# Patient Record
Sex: Male | Born: 1955 | Race: Black or African American | Hispanic: No | Smoking: Current every day smoker
Health system: Southern US, Community
[De-identification: ages and names within clinical notes are randomized; demographics above are authoritative.]

## PROBLEM LIST (undated history)

## (undated) DIAGNOSIS — I10 Essential (primary) hypertension: Secondary | ICD-10-CM

## (undated) HISTORY — DX: Essential (primary) hypertension: I10

---

## 1980-07-28 HISTORY — PX: TESTICLE SURGERY: SHX794

## 2008-01-13 ENCOUNTER — Emergency Department (HOSPITAL_COMMUNITY): Admission: EM | Admit: 2008-01-13 | Discharge: 2008-01-13 | Payer: Self-pay | Admitting: Emergency Medicine

## 2012-08-04 ENCOUNTER — Emergency Department (HOSPITAL_COMMUNITY): Payer: BC Managed Care – PPO

## 2012-08-04 ENCOUNTER — Emergency Department (HOSPITAL_COMMUNITY)
Admission: EM | Admit: 2012-08-04 | Discharge: 2012-08-04 | Disposition: A | Discharge status: Home / Self Care | Payer: BC Managed Care – PPO | Attending: Emergency Medicine | Admitting: Emergency Medicine

## 2012-08-04 ENCOUNTER — Encounter (HOSPITAL_COMMUNITY): Payer: Self-pay | Admitting: *Deleted

## 2012-08-04 DIAGNOSIS — N453 Epididymo-orchitis: Secondary | ICD-10-CM | POA: Insufficient documentation

## 2012-08-04 DIAGNOSIS — Z9089 Acquired absence of other organs: Secondary | ICD-10-CM | POA: Insufficient documentation

## 2012-08-04 DIAGNOSIS — F17200 Nicotine dependence, unspecified, uncomplicated: Secondary | ICD-10-CM | POA: Insufficient documentation

## 2012-08-04 DIAGNOSIS — Z7982 Long term (current) use of aspirin: Secondary | ICD-10-CM | POA: Insufficient documentation

## 2012-08-04 DIAGNOSIS — N451 Epididymitis: Secondary | ICD-10-CM

## 2012-08-04 DIAGNOSIS — N509 Disorder of male genital organs, unspecified: Secondary | ICD-10-CM | POA: Insufficient documentation

## 2012-08-04 LAB — URINE MICROSCOPIC-ADD ON

## 2012-08-04 LAB — URINALYSIS, ROUTINE W REFLEX MICROSCOPIC
Glucose, UA: NEGATIVE mg/dL
Hgb urine dipstick: NEGATIVE
Protein, ur: NEGATIVE mg/dL
pH: 6 (ref 5.0–8.0)

## 2012-08-04 MED ORDER — CIPROFLOXACIN HCL 500 MG PO TABS
500.0000 mg | ORAL_TABLET | Freq: Once | ORAL | Status: AC
  Administered 2012-08-04: 500 mg via ORAL
  Filled 2012-08-04: qty 1

## 2012-08-04 MED ORDER — CIPROFLOXACIN HCL 500 MG PO TABS: 500.0000 mg | ORAL_TABLET | Freq: Two times a day (BID) | ORAL | Status: DC

## 2012-08-04 NOTE — ED Provider Notes (Signed)
MSE was initiated and I personally evaluated the patient and placed orders including UA and scrotal ultrasound at  5:54 PM on August 04, 2012.  57 year old male presents with swelling and tenderness to his testicle beginning on Thursday after flopping down onto his testicle in a chair. He only has his left testicle is is right testicle was injured in a baseball accident many years ago. Pain is intermittent, only present if he sits for any minute or presses it in one certain area. On exam patient has one testicle that is difficult to determine whether or not it is swollen because he does not have another to compare to. No erythema. No penile tenderness or discharge. Tenderness to palpation of the testicle posteriorly. Obtain a urine sample and scrotal ultrasound.  The patient appears stable so that the remainder of the MSE may be completed by another provider.  Trevor Mace, PA-C 08/04/12 1756

## 2012-08-04 NOTE — ED Provider Notes (Signed)
History    CSN: [id number] Arrival date & time 08/04/12  1721 First MD Initiated Contact with Patient 08/04/12 1753      Chief Complaint  Patient presents with  . Groin Swelling    HPI Patient complains of testicular pain since Thursday afternoon. Patient states he sat down on the chair and sat onto his testicle. 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  He initially did not think too much of it but he has had persistent pain and noticed some swelling on the testicle. Patient denies any discharge. He does not have any nausea vomiting fevers or other complaints. History reviewed. No pertinent past medical history.  Past Surgical History  Procedure Date  . Testicle surgery 1982    History reviewed. No pertinent family history.  History  Substance Use Topics  . Smoking status: Current Every Day Smoker    Types: Cigarettes  . Smokeless tobacco: Not on file  . Alcohol Use: Yes      Review of Systems  All other systems reviewed and are negative.    Allergies  Review of patient's allergies indicates no known allergies.  Home Medications   Current Outpatient Rx  Name  Route  Sig  Dispense  Refill  . ASPIRIN 325 MG PO TABS   Oral   Take 325 mg by mouth every 4 (four) hours as needed. Pain         . IBUPROFEN 200 MG PO TABS   Oral   Take 200 mg by mouth every 6 (six) hours as needed. Pain           BP 142/83  Pulse 91  Temp 99.5 F (37.5 C) (Oral)  Resp 20  Ht 6\' 1"  (1.854 m)  Wt 150 lb (68.04 kg)  BMI 19.79 kg/m2  SpO2 100%  Physical Exam  Nursing note and vitals reviewed. Constitutional: He appears well-developed and well-nourished. No distress.  HENT:  Head: Normocephalic and atraumatic.  Right Ear: External ear normal.  Left Ear: External ear normal.  Eyes: Conjunctivae normal are normal. Right eye exhibits no discharge. Left eye exhibits no discharge. No scleral icterus.  Neck: Neck supple. No tracheal deviation  present.  Cardiovascular: Normal rate, regular rhythm and intact distal pulses.   Pulmonary/Chest: Effort normal and breath sounds normal. No stridor. No respiratory distress. He has no wheezes. He has no rales.  Abdominal: Soft. Bowel sounds are normal. He exhibits no distension. There is no tenderness. There is no rebound and no guarding.  Genitourinary:       Tenderness to palpation proximal aspect of testicle, as small amount of induration, no scrotal erythema or edema  Musculoskeletal: He exhibits no edema and no tenderness.  Neurological: He is alert. He has normal strength. No sensory deficit. Cranial nerve deficit:  no gross defecits noted. He exhibits normal muscle tone. He displays no seizure activity. Coordination normal.  Skin: Skin is warm and dry. No rash noted.  Psychiatric: He has a normal mood and affect.    ED Course  Procedures (including critical care time)  Labs Reviewed  URINALYSIS, ROUTINE W REFLEX MICROSCOPIC - Abnormal; Notable for the following:    Color, Urine AMBER (*)  BIOCHEMICALS MAY BE AFFECTED BY COLOR   Bilirubin Urine SMALL (*)     Ketones, ur TRACE (*)     Leukocytes, UA MODERATE (*)     All other components within normal limits  URINE MICROSCOPIC-ADD ON - Abnormal; Notable for the  following:    Squamous Epithelial / LPF FEW (*)     Casts GRANULAR CAST (*)     All other components within normal limits   US Scrotum  08/04/2012  *RADIOLOGY REPORT*  Clinical Data: Groin swelling, pain  ULTRASOUND OF SCROTUM  Technique:  Complete ultrasound examination of the testicles, epididymis, and other scrotal structures was performed.  Comparison:  None.  Findings:  Right testis:  Measures 4 by 2.3 x 1.8 cm.  There is heterogeneous echogenicity without definite focal mass.  No evidence of testicular torsion.  Normal arterial and venous waveform. Testicular microlithiasis noted.  Left testis:  Surgically absent  Right epididymis:  There is enlargement of the right  epididymis measures 2 x 1.7 cm.  There is increased flow in the right epididymis.  Findings are highly suspicious for right epididymitis. Clinical correlation is necessary.  A cyst in the right epididymis measures 1.1 x 0.8 cm.  Hydrocele: Absent  Varicocele: Absent  IMPRESSION:  1.  There is heterogeneous echogenicity of the right testicle without focal mass or testicular torsion.  Multiple punctate calcifications are noted consistent with microlithiasis. 2.  There is enlargement of the right epididymis with increased flow.  Findings are highly suspicious for right epididymitis. Clinical correlation is necessary.  A cyst in right epididymis measures 1.1 x 0.8 cm. 3.  No varicocele or hydrocele.   Original Report Authenticated By: Natasha Mead, M.D.        MDM  The patient's ultrasound does not show significant injury. He does show the possibility of an epididymitis. Urinalysis does show a few white blood cells which would be consistent with epididymitis.  The patient is nontoxic and well-appearing. He'll be discharged on a course of oral antibiotics.        Celene Kras, MD 08/04/12 2045

## 2012-08-04 NOTE — ED Notes (Signed)
Pt reports swelling to testicle, denies known injury other than "flopping down in a chair." states "I only have 1 due to injury back in 1982."

## 2012-08-04 NOTE — ED Notes (Signed)
Patient transported to Ultrasound 

## 2014-06-25 IMAGING — US US SCROTUM
1 series · 13 of 25 positions shown · non-contrast
Comparison: None.

CLINICAL DATA: Groin swelling, pain

ULTRASOUND OF SCROTUM
TECHNIQUE: Complete ultrasound examination of the testicles,
epididymis, and other scrotal structures was performed.

[Series 1: us scrotum · 0.08mm/px · 13 of 34 slices shown]
[im 1/34]
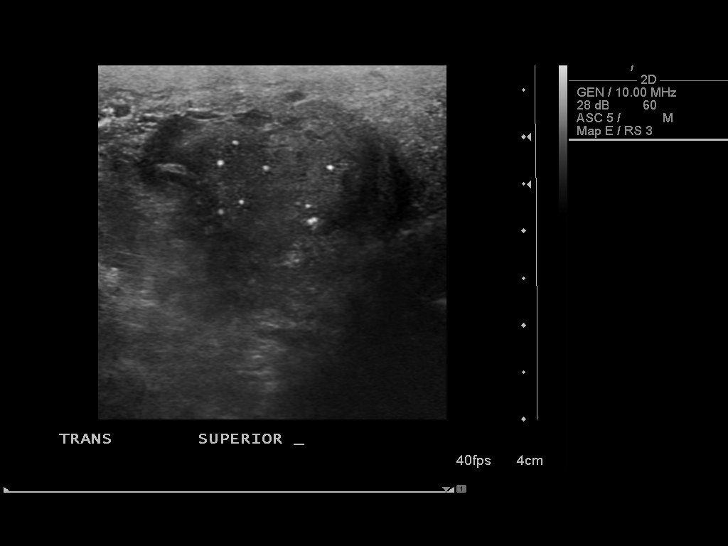
[im 3/34]
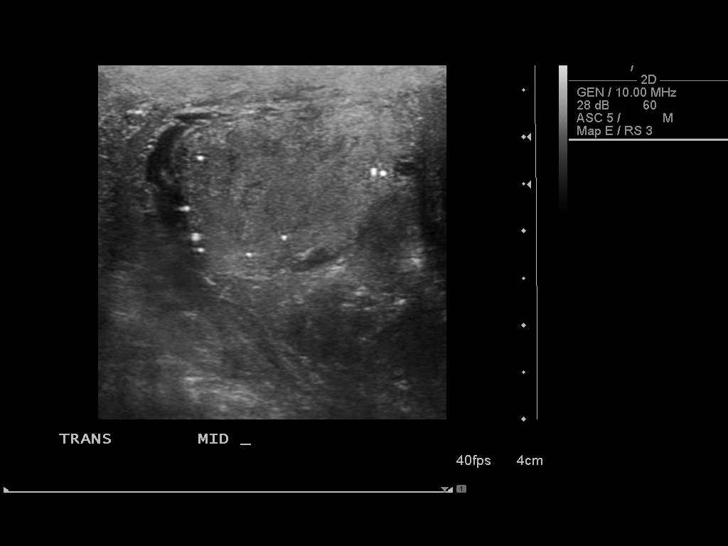
[im 6/34]
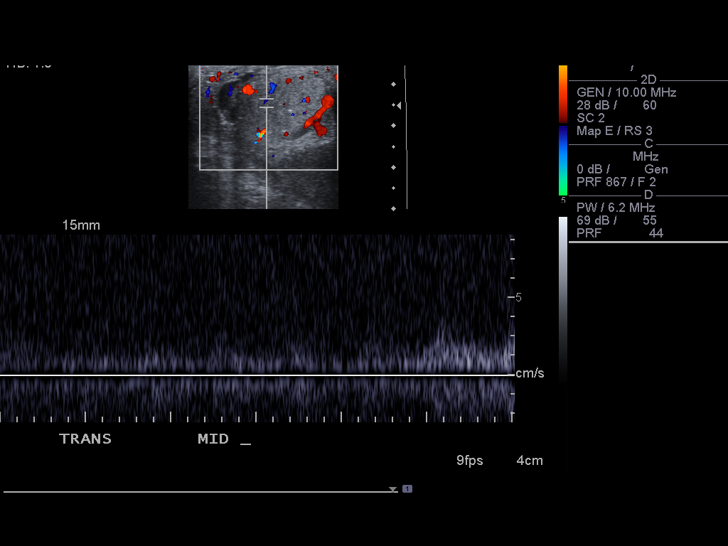
[im 9/34]
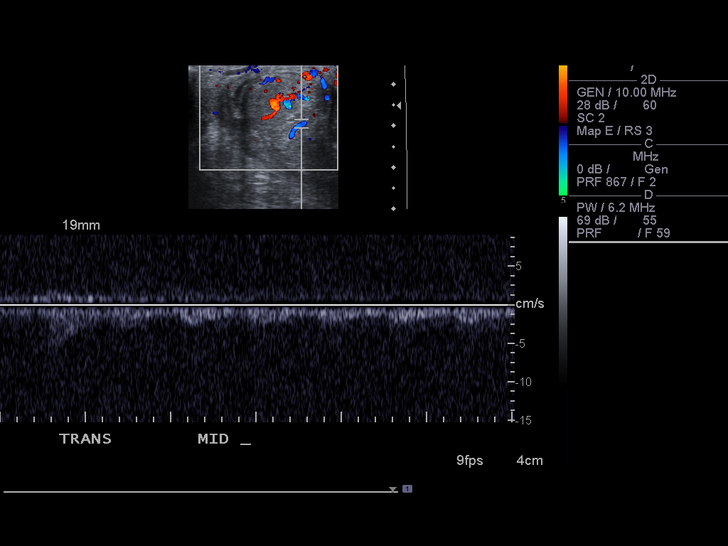
[im 12/34]
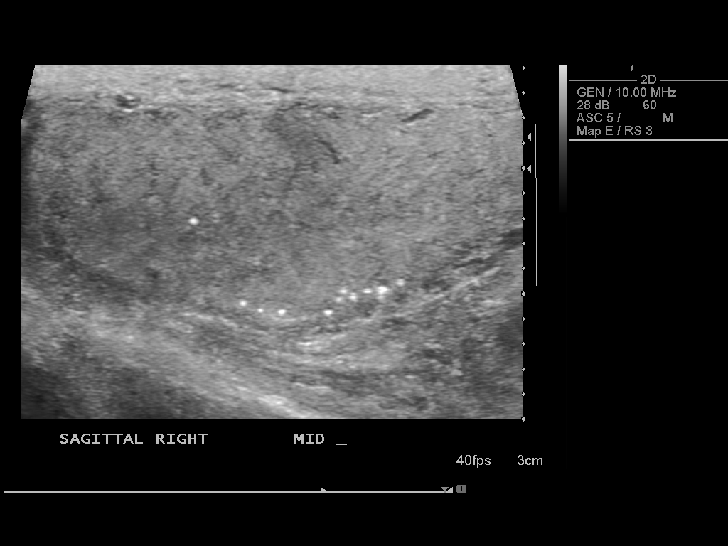
[im 14/34]
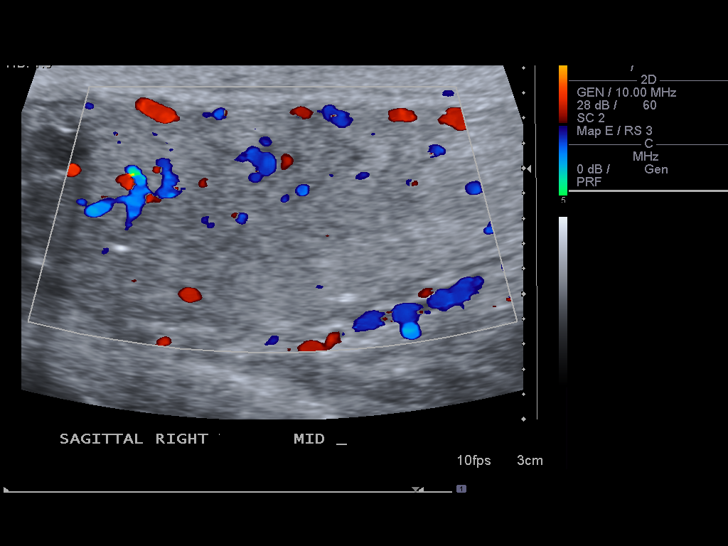
[im 17/34]
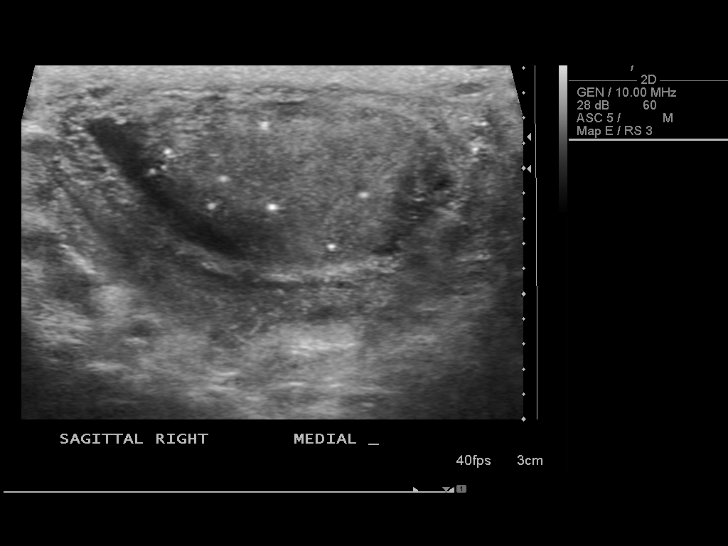
[im 20/34]
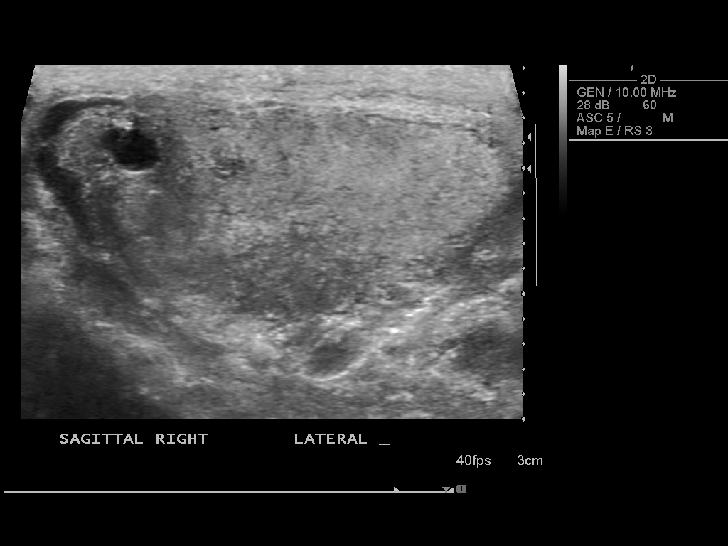
[im 23/34]
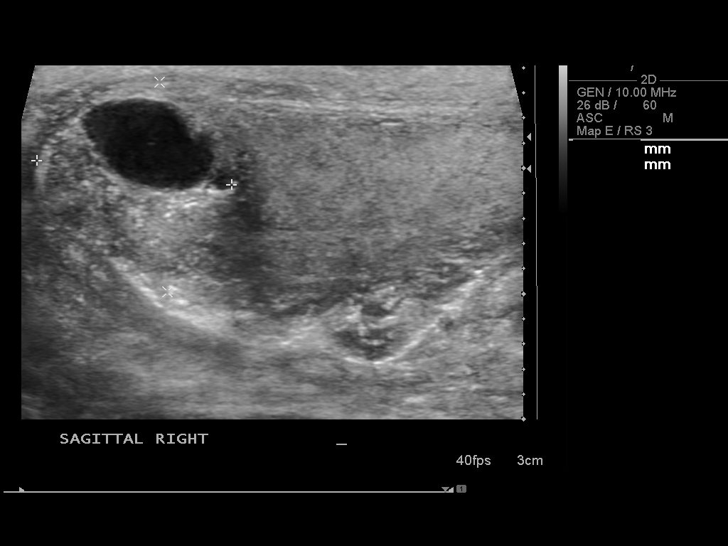
[im 25/34]
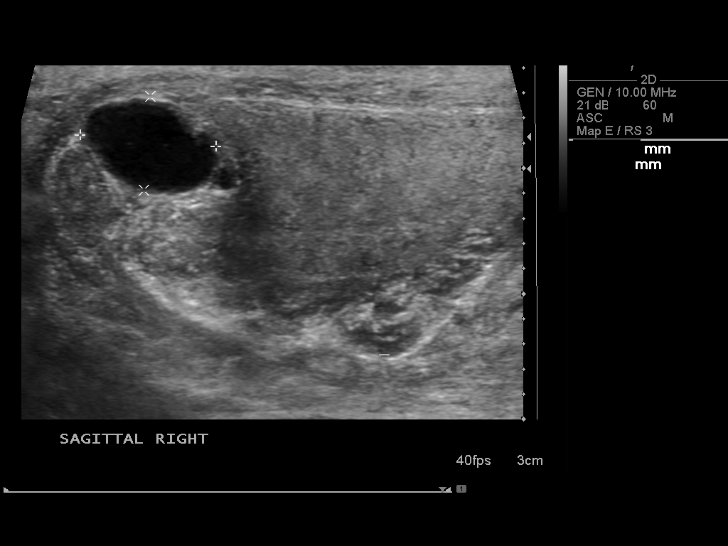
[im 28/34]
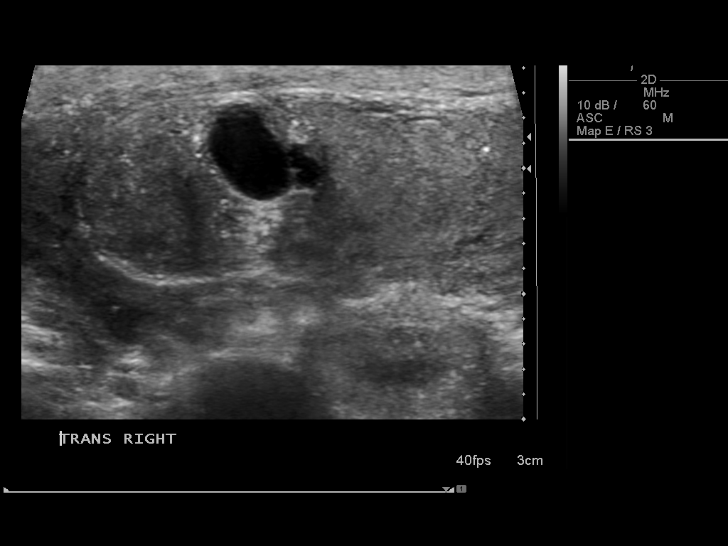
[im 31/34]
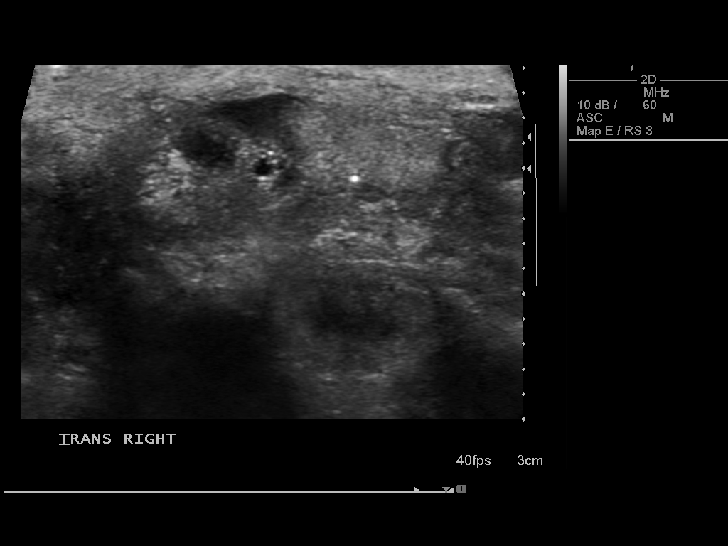
[im 34/34]
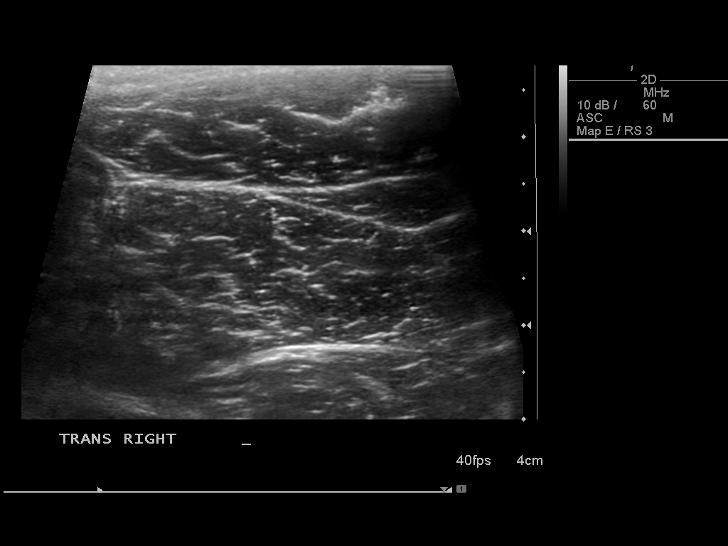

[13 of 25 positions shown; findings below may reference images not displayed]

FINDINGS: Right testis:  Measures 4 by 2.3 x 1.8 cm.  There is heterogeneous
echogenicity without definite focal mass.  No evidence of
testicular torsion.  Normal arterial and venous waveform.
Testicular microlithiasis noted.

Left testis:  Surgically absent

Right epididymis:  There is enlargement of the right epididymis
measures 2 x 1.7 cm.  There is increased flow in the right
epididymis.  Findings are highly suspicious for right epididymitis.
Clinical correlation is necessary.  A cyst in the right epididymis
measures 1.1 x 0.8 cm.

Hydrocele: Absent

Varicocele:
Absent
IMPRESSION: 1.  There is heterogeneous echogenicity of the right testicle
without focal mass or testicular torsion.  Multiple punctate
calcifications are noted consistent with microlithiasis.
2.  There is enlargement of the right epididymis with increased
flow.  Findings are highly suspicious for right epididymitis.
Clinical correlation is necessary.  A cyst in right epididymis
measures 1.1 x 0.8 cm.
3.  No varicocele or hydrocele.

## 2022-02-25 ENCOUNTER — Other Ambulatory Visit: Payer: Self-pay | Admitting: *Deleted

## 2022-02-25 NOTE — Patient Outreach (Signed)
[facility]) Care Management  02/25/2022  Isaac Parsons Jun 09, 1956 [id number]    Care Management   Outreach Note  02/25/2022 Name: Isaac Parsons MRN: [id number] DOB: July 02, 1956  Unsuccessful outreach #1 and unable to leave a HIPAA voice message.  Follow Up Plan:  The care management team will reach out to the patient again over the next few days.   Elliot Cousin, RN Care Management Coordinator [facility] Main Office [phone]

## 2022-02-28 ENCOUNTER — Other Ambulatory Visit: Payer: Self-pay | Admitting: *Deleted

## 2022-02-28 NOTE — Patient Outreach (Signed)
[facility]) Care Management  02/28/2022  Octavio Matheney 05-10-1956 [id number]   Care Management   Outreach Note  02/28/2022 Name: Isaac Parsons MRN: [id number] DOB: 1955-08-01  Unsuccessful outreach #2  Follow Up Plan:  The care management team will reach out to the patient again over the next 7 days.   Elliot Cousin, RN Care Management Coordinator [facility] Main Office [phone]

## 2022-03-05 ENCOUNTER — Other Ambulatory Visit: Payer: Self-pay | Admitting: *Deleted

## 2022-03-05 NOTE — Patient Outreach (Signed)
  Care Coordination   03/05/2022 Name: Isaac Parsons MRN: [id number] DOB: 11/08/1955   Care Coordination Outreach Attempts:  A third unsuccessful outreach was attempted today to offer the patient with information about available care coordination services as a benefit of their health plan.   Follow Up Plan:  No further outreach attempts will be made at this time. We have been unable to contact the patient to offer or enroll patient in care coordination services  Encounter Outcome:  No Answer  Care Coordination Interventions Activated:  No   Care Coordination Interventions:  No, not indicated    Elliot Cousin, RN Care Management Coordinator [facility] Main Office [phone]

## 2022-07-14 ENCOUNTER — Telehealth: Payer: Self-pay | Admitting: *Deleted

## 2022-07-14 NOTE — Patient Outreach (Signed)
  Care Coordination   07/14/2022 Name: Isaac Parsons MRN: [id number] DOB: 28-Nov-1955   Care Coordination Outreach Attempts:  An unsuccessful telephone outreach was attempted today to offer the patient information about available care coordination services as a benefit of their health plan.   Follow Up Plan:  Additional outreach attempts will be made to offer the patient care coordination information and services.   Encounter Outcome:  No Answer   Care Coordination Interventions:  No, not indicated    Elliot Cousin, RN Care Management Coordinator [facility] Main Office [phone]

## 2022-11-12 ENCOUNTER — Emergency Department (HOSPITAL_COMMUNITY)
Admission: EM | Admit: 2022-11-12 | Discharge: 2022-11-12 | Disposition: A | Discharge status: Home / Self Care | Payer: No Typology Code available for payment source | Attending: Emergency Medicine | Admitting: Emergency Medicine

## 2022-11-12 ENCOUNTER — Other Ambulatory Visit: Payer: Self-pay

## 2022-11-12 ENCOUNTER — Emergency Department (HOSPITAL_COMMUNITY): Payer: No Typology Code available for payment source

## 2022-11-12 ENCOUNTER — Encounter (HOSPITAL_COMMUNITY): Payer: Self-pay

## 2022-11-12 DIAGNOSIS — I10 Essential (primary) hypertension: Secondary | ICD-10-CM | POA: Insufficient documentation

## 2022-11-12 DIAGNOSIS — Z720 Tobacco use: Secondary | ICD-10-CM | POA: Diagnosis not present

## 2022-11-12 DIAGNOSIS — R079 Chest pain, unspecified: Secondary | ICD-10-CM

## 2022-11-12 DIAGNOSIS — Z79899 Other long term (current) drug therapy: Secondary | ICD-10-CM | POA: Diagnosis not present

## 2022-11-12 DIAGNOSIS — R0789 Other chest pain: Secondary | ICD-10-CM | POA: Diagnosis not present

## 2022-11-12 LAB — CBC WITH DIFFERENTIAL/PLATELET
Abs Immature Granulocytes: 0.03 10*3/uL (ref 0.00–0.07)
Basophils Absolute: 0.1 10*3/uL (ref 0.0–0.1)
Basophils Relative: 1 %
Eosinophils Absolute: 0.4 10*3/uL (ref 0.0–0.5)
Eosinophils Relative: 4 %
HCT: 42.2 % (ref 39.0–52.0)
Hemoglobin: 13.6 g/dL (ref 13.0–17.0)
Immature Granulocytes: 0 %
Lymphocytes Relative: 26 %
Lymphs Abs: 2.1 10*3/uL (ref 0.7–4.0)
MCH: 30.2 pg (ref 26.0–34.0)
MCHC: 32.2 g/dL (ref 30.0–36.0)
MCV: 93.8 fL (ref 80.0–100.0)
Monocytes Absolute: 0.9 10*3/uL (ref 0.1–1.0)
Monocytes Relative: 11 %
Neutro Abs: 4.7 10*3/uL (ref 1.7–7.7)
Neutrophils Relative %: 58 %
Platelets: 167 10*3/uL (ref 150–400)
RBC: 4.5 MIL/uL (ref 4.22–5.81)
RDW: 13.2 % (ref 11.5–15.5)
WBC: 8.3 10*3/uL (ref 4.0–10.5)
nRBC: 0 % (ref 0.0–0.2)

## 2022-11-12 LAB — COMPREHENSIVE METABOLIC PANEL
ALT: 15 U/L (ref 0–44)
AST: 18 U/L (ref 15–41)
Albumin: 4.1 g/dL (ref 3.5–5.0)
Alkaline Phosphatase: 82 U/L (ref 38–126)
Anion gap: 10 (ref 5–15)
BUN: 16 mg/dL (ref 8–23)
CO2: 25 mmol/L (ref 22–32)
Calcium: 8.8 mg/dL — ABNORMAL LOW (ref 8.9–10.3)
Chloride: 106 mmol/L (ref 98–111)
Creatinine, Ser: 0.83 mg/dL (ref 0.61–1.24)
GFR, Estimated: >60 mL/min (ref >60)
Glucose, Bld: 109 mg/dL — ABNORMAL HIGH (ref 70–99)
Potassium: 4.1 mmol/L (ref 3.5–5.1)
Sodium: 141 mmol/L (ref 135–145)
Total Bilirubin: 0.6 mg/dL (ref 0.3–1.2)
Total Protein: 7.5 g/dL (ref 6.5–8.1)

## 2022-11-12 LAB — TROPONIN I (HIGH SENSITIVITY)
Troponin I (High Sensitivity): 3 ng/L (ref <18)
Troponin I (High Sensitivity): 2 ng/L (ref <18)

## 2022-11-12 MED ORDER — AMLODIPINE BESYLATE 2.5 MG PO TABS: 2.5000 mg | ORAL_TABLET | Freq: Every day | ORAL | 1 refills | Status: AC

## 2022-11-12 MED ORDER — ASPIRIN 81 MG PO CHEW
324.0000 mg | CHEWABLE_TABLET | Freq: Once | ORAL | Status: AC
  Administered 2022-11-12: 324 mg via ORAL
  Filled 2022-11-12: qty 4

## 2022-11-12 NOTE — Discharge Instructions (Addendum)
Your blood pressure was elevated today but all your lab test and your x-ray today look normal.  However given the nature of your symptoms still there is concern for your heart and a referral to the cardiologist was made and they will call you for a follow-up.  You can return to work however if you start having any type of pain with activity you have more episodes of sweating, passing out or inability to catch her breath you should return to the emergency room.  You were given a prescription for a blood pressure medication today.  You can start that and take it once a day.  Someone from the social work team should get a hold of you to help you find a regular doctor.  Please call your insurance company customer service phone number on your card to obtain an in network primary care provider. Then schedule a follow up appointment within 3-4 days from this emergency room visit.

## 2022-11-12 NOTE — ED Triage Notes (Signed)
Patient is here for evaluation of chest pain. Reports he had a sharp pain in the left chest that caused him to clutch his chest. Denies pain at this time.

## 2022-11-12 NOTE — ED Provider Notes (Signed)
[place] EMERGENCY DEPARTMENT AT [facility] Provider Note   CSN: [id number] Arrival date & time: 11/12/22  0805     History  Chief Complaint  Patient presents with   Chest Pain    Isaac Parsons is a 67 y.o. male.  Patient is a 67 year old male with no known medical history other than tobacco use who is presenting today with complaints of sudden onset of chest pain.  This occurred today when he was at work.  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  He reports that he sat outside he was taking deep breaths but felt generally unwell however it resolved within about 10 minutes.  He reports after resolution he felt much better he went back in and he was going to start working again but his boss noticed that he was diaphoretic and looked unwell and thought he needed to come to the emergency room.  Patient reports that he feels fine now he has no symptoms.  He denies this ever happening in the past.  No recent cough, congestion, fevers, abdominal pain, nausea or vomiting.  No unilateral leg pain or swelling.  No recent immobilization.  He takes no medications and the last time he saw doctor for checkup was over 10 years ago.  He denies family history of any type of cardiac disease such as CAD, CHF or requiring stents.  He does have a family history of cancer  The history is provided by the patient.  Chest Pain      Home Medications Prior to Admission medications   Medication Sig Start Date End Date Taking? Authorizing Provider  amLODipine (NORVASC) 2.5 MG tablet Take 1 tablet (2.5 mg total) by mouth daily. 11/12/22  Yes Gwyneth Sprout, MD  aspirin 325 MG tablet Take 325 mg by mouth every 4 (four) hours as needed. Pain    [provider]  ciprofloxacin (CIPRO) 500 MG tablet Take 1 tablet (500 mg total) by mouth 2 (two) times  daily. 08/04/12   Linwood Dibbles, MD  ibuprofen (ADVIL,MOTRIN) 200 MG tablet Take 200 mg by mouth every 6 (six) hours as needed. Pain    [provider]      Allergies    Patient has no known allergies.    Review of Systems   Review of Systems  Cardiovascular:  Positive for chest pain.    Physical Exam Updated Vital Signs BP (!) 162/96   Pulse 73   Temp (!) 97.5 F (36.4 C)   Resp 20   Ht  (1.854 m)   Wt 68 kg   SpO2 100%   BMI 19.79 kg/m  Physical Exam Vitals and nursing note reviewed.  Constitutional:      General: He is not in acute distress.    Appearance: He is well-developed.  HENT:     Head: Normocephalic and atraumatic.     Nose: Nose normal.  Eyes:     Conjunctiva/sclera: Conjunctivae normal.     Pupils: Pupils are equal, round, and reactive to light.  Cardiovascular:     Rate and Rhythm: Normal rate and regular rhythm.     Pulses: Normal pulses.     Heart sounds: No murmur heard. Pulmonary:     Effort: Pulmonary effort is normal. No respiratory distress.     Breath sounds: Normal breath sounds.  No wheezing or rales.  Abdominal:     General: There is no distension.     Palpations: Abdomen is soft.     Tenderness: There is no abdominal tenderness. There is no guarding or rebound.  Musculoskeletal:        General: No tenderness. Normal range of motion.     Cervical back: Normal range of motion and neck supple.     Right lower leg: No edema.     Left lower leg: No edema.  Skin:    General: Skin is warm and dry.     Findings: No erythema or rash.  Neurological:     Mental Status: He is alert and oriented to person, place, and time. Mental status is at baseline.  Psychiatric:        Mood and Affect: Mood normal.        Behavior: Behavior normal.        Thought Content: Thought content normal.     ED Results / Procedures / Treatments   Labs (all labs ordered are listed, but only abnormal results are displayed) Labs Reviewed  COMPREHENSIVE  METABOLIC PANEL - Abnormal; Notable for the following components:      Result Value   Glucose, Bld 109 (*)    Calcium 8.8 (*)    All other components within normal limits  CBC WITH DIFFERENTIAL/PLATELET  TROPONIN I (HIGH SENSITIVITY)  TROPONIN I (HIGH SENSITIVITY)    EKG EKG Interpretation  Date/Time:  Wednesday November 12 2022 08:19:01 EDT Ventricular Rate:  74 PR Interval:  159 QRS Duration: 85 QT Interval:  399 QTC Calculation: 443 R Axis:   58 Text Interpretation: Sinus rhythm Anterior infarct, old No old tracing to compare Confirmed by Linwood Dibbles [id number]) on 11/12/2022 8:24:11 AM  Radiology DG Chest Port 1 View  Result Date: 11/12/2022 CLINICAL DATA:  Chest pain. EXAM: PORTABLE CHEST 1 VIEW COMPARISON:  None Available. FINDINGS: Clear lungs. Normal heart size and mediastinal contours. No pleural effusion or pneumothorax. Visualized bones and upper abdomen are unremarkable. IMPRESSION: No evidence of acute cardiopulmonary disease. Electronically Signed   By: Orvan Falconer M.D.   On: 11/12/2022 09:12    Procedures Procedures    Medications Ordered in ED Medications  aspirin chewable tablet 324 mg (324 mg Oral Given 11/12/22 0859)    ED Course/ Medical Decision Making/ A&P                             Medical Decision Making Amount and/or Complexity of Data Reviewed Labs: ordered. Decision-making details documented in ED Course. Radiology: ordered and independent interpretation performed. Decision-making details documented in ED Course. ECG/medicine tests: ordered and independent interpretation performed. Decision-making details documented in ED Course.  Risk OTC drugs.   Pt with multiple medical problems and comorbidities and presenting today with a complaint that caries a high risk for morbidity and mortality. Presenting today with chest pain concerning for angina that occurred today at work.  NO infectious sx, low risk for PE with low risk well's.  No family hx of  MI or stents.  Low suspicion for dissection.  I independently interpreted patient's EKG and labs.  EKG without acute findings today no ST elevation or depression present.  CBC, BMP, troponin x 2 are all within normal limits.  I have independently visualized and interpreted pt's images today.  Chest x-ray within normal limits. Patient remains asymptomatic here.  Patient is noted to be hypertensive  throughout his stay here most likely is more chronic in nature as he has not seen a doctor in more than 10 years.  Heart score of 4 due to moderate suspicion, smoking and undiagnosed hypertension.  Discussed the findings with the patient.  He has had no further episodes of pain.  At this time will do an ambulatory referral for cardiology.  Patient started on low-dose amlodipine 2.5 mg.  Consulted with transition of care to help patient find PCP as well.  Gave patient strict return precautions if he has any further pain or episodes like this again he needs to return immediately as he will need more investigation.  Patient is comfortable with this plan.  He is happy to go home today.  No indication for further testing at this time.        Final Clinical Impression(s) / ED Diagnoses Final diagnoses:  Uncontrolled hypertension  Chest pain, unspecified type    Rx / DC Orders ED Discharge Orders          Ordered    amLODipine (NORVASC) 2.5 MG tablet  Daily        11/12/22 1208    Ambulatory referral to Cardiology       Comments: Heart score of 4 with chest pain for and resolved.  Some risk factors no hx   11/12/22 1208              Gwyneth Sprout, MD 11/12/22 1208

## 2022-11-12 NOTE — Care Management (Signed)
Transition of Care [facility]) - Emergency Department Mini Assessment   Patient Details  Name: Isaac Parsons MRN: [id number] Date of Birth: 16-Mar-1956  Transition of Care [facility]) CM/SW Contact:    Lavenia Atlas, RN Phone Number: 11/12/2022, 12:12 PM   Clinical Narrative: Patient presented to [facility] ED for chest pain. This RNCM received TOC consult for PCP needs. This RNCM spoke with patient to offer options, patient chose to call his insurance company to obtain an in network PCP.  This RNCM added [facility] Medicine to AVS and advised patient to call back if he has any difficulties.   No additional TOC needs at this time.   ED Mini Assessment: What brought you to the Emergency Department? : chest pain  Barriers to Discharge: No Barriers Identified  Barrier interventions: PCP counseling  Means of departure: Car  Interventions which prevented an admission or readmission: PCP counseling    Patient Contact and Communications        ,            CMS Medicare.gov Compare Post Acute Care list provided to:: Patient Choice offered to / list presented to : Patient  Admission diagnosis:  chest pain There are no problems to display for this patient.  PCP:  Pcp, No Pharmacy:   [id number] [place], [place] - [phone] AT [facility] OF [facility] [address] [place] [place] [address] Phone: [phone] Fax: [phone]

## 2022-11-14 ENCOUNTER — Encounter: Payer: Self-pay | Admitting: Gastroenterology

## 2022-11-17 ENCOUNTER — Telehealth: Payer: Self-pay | Admitting: *Deleted

## 2022-11-17 NOTE — Telephone Encounter (Signed)
        Patient  visited [facility] on 11/12/2022  for treatment    Telephone encounter attempt :  1st  A HIPAA compliant voice message was left requesting a return call.  Instructed patient to call back at [phone].  Yehuda Mao [facility] -[facility] [place], Population Health [phone] [address] [place] , [place] [address] Email : [email or web address]

## 2022-11-18 ENCOUNTER — Telehealth: Payer: Self-pay | Admitting: *Deleted

## 2022-11-18 NOTE — Telephone Encounter (Signed)
     Patient  visit on 11/12/2022  at [place]  was for treatment   Have you been able to follow up with your primary care physician? Feeling great back to work Bp is good now , has medication and transportation  The patient was able to obtain any needed medicine or equipment.  Are there diet recommendations that you are having difficulty following?  Patient expresses understanding of discharge instructions and education provided has no other needs at this time.    Yehuda Mao Greenauer -[facility] [place], Population Health [phone] [address] [place] , [place] [address] Email : [email or web address]

## 2022-11-24 ENCOUNTER — Encounter: Payer: Self-pay | Admitting: *Deleted

## 2022-12-11 ENCOUNTER — Ambulatory Visit (AMBULATORY_SURGERY_CENTER): Payer: Medicare HMO

## 2022-12-11 VITALS — Ht 73.0 in | Wt 150.0 lb

## 2022-12-11 DIAGNOSIS — Z1211 Encounter for screening for malignant neoplasm of colon: Secondary | ICD-10-CM

## 2022-12-11 MED ORDER — NA SULFATE-K SULFATE-MG SULF 17.5-3.13-1.6 GM/177ML PO SOLN: 1.0000 | Freq: Once | ORAL | 0 refills | Status: AC

## 2022-12-11 NOTE — Progress Notes (Signed)
No egg or soy allergy known to patient  No issues known to pt with past sedation with any surgeries or procedures Patient denies ever being told they had issues or difficulty with intubation  No FH of Malignant Hyperthermia Pt is not on diet pills Pt is not on  home 02  Pt is not on blood thinners  Pt denies issues with constipation  No A fib or A flutter Have any cardiac testing pending--no Pt instructed to use Singlecare.com or GoodRx for a price reduction on prep   

## 2023-01-01 ENCOUNTER — Telehealth: Payer: Self-pay

## 2023-01-01 NOTE — Telephone Encounter (Signed)
This RNCM received inbound call from patient who was advising of the type of cancer his mother had. This RNCM explained her role and directed patient to call Dr. Rebecka Apley office to update them, as patient seemed to dial the incorrect phone number.   No additional TOC neees

## 2023-01-02 ENCOUNTER — Encounter: Payer: Self-pay | Admitting: Gastroenterology

## 2023-01-02 ENCOUNTER — Ambulatory Visit: Payer: No Typology Code available for payment source | Admitting: Gastroenterology

## 2023-01-02 VITALS — BP 183/95 | HR 66 | Temp 98.6°F | Resp 21 | Ht 73.0 in | Wt 150.0 lb

## 2023-01-02 DIAGNOSIS — Z800 Family history of malignant neoplasm of digestive organs: Secondary | ICD-10-CM

## 2023-01-02 DIAGNOSIS — Z1211 Encounter for screening for malignant neoplasm of colon: Secondary | ICD-10-CM

## 2023-01-02 DIAGNOSIS — I10 Essential (primary) hypertension: Secondary | ICD-10-CM | POA: Diagnosis not present

## 2023-01-02 MED ORDER — SODIUM CHLORIDE 0.9 % IV SOLN: 500.0000 mL | Freq: Once | INTRAVENOUS | Status: DC

## 2023-01-02 NOTE — Patient Instructions (Signed)
Repeat Colonoscopy in 5 years for Screening purposes.  YOU HAD AN ENDOSCOPIC PROCEDURE TODAY AT THE [facility]:   Refer to the procedure report that was given to you for any specific questions about what was found during the examination.  If the procedure report does not answer your questions, please call your gastroenterologist to clarify.  If you requested that your care partner not be given the details of your procedure findings, then the procedure report has been included in a sealed envelope for you to review at your convenience later.  YOU SHOULD EXPECT: Some feelings of bloating in the abdomen. Passage of more gas than usual.  Walking can help get rid of the air that was put into your GI tract during the procedure and reduce the bloating. If you had a lower endoscopy (such as a colonoscopy or flexible sigmoidoscopy) you may notice spotting of blood in your stool or on the toilet paper. If you underwent a bowel prep for your procedure, you may not have a normal bowel movement for a few days.  Please Note:  You might notice some irritation and congestion in your nose or some drainage.  This is from the oxygen used during your procedure.  There is no need for concern and it should clear up in a day or so.  SYMPTOMS TO REPORT IMMEDIATELY:  Following lower endoscopy (colonoscopy or flexible sigmoidoscopy):  Excessive amounts of blood in the stool  Significant tenderness or worsening of abdominal pains  Swelling of the abdomen that is new, acute  Fever of 100F or higher  For urgent or emergent issues, a gastroenterologist can be reached at any hour by calling [phone]. Do not use MyChart messaging for urgent concerns.    DIET:  We do recommend a small meal at first, but then you may proceed to your regular diet.  Drink plenty of fluids but you should avoid alcoholic beverages for 24 hours.  ACTIVITY:  You should plan to take it easy for the rest of today and you should NOT  DRIVE or use heavy machinery until tomorrow (because of the sedation medicines used during the test).    FOLLOW UP: Our staff will call the number listed on your records the next business day following your procedure.  We will call around 7:15- 8:00 am to check on you and address any questions or concerns that you may have regarding the information given to you following your procedure. If we do not reach you, we will leave a message.     If any biopsies were taken you will be contacted by phone or by letter within the next 1-3 weeks.  Please call us at [phone] if you have not heard about the biopsies in 3 weeks.    SIGNATURES/CONFIDENTIALITY: You and/or your care partner have signed paperwork which will be entered into your electronic medical record.  These signatures attest to the fact that that the information above on your After Visit Summary has been reviewed and is understood.  Full responsibility of the confidentiality of this discharge information lies with you and/or your care-partner.

## 2023-01-02 NOTE — Progress Notes (Signed)
History and Physical:  This patient presents for endoscopic testing for: Encounter Diagnosis  Name Primary?   Family history of colon cancer in mother Yes    First screening colonoscopy. Patient denies chronic abdominal pain, rectal bleeding, constipation or diarrhea.   Patient is otherwise without complaints or active issues today.   Past Medical History: Past Medical History:  Diagnosis Date   Hypertension      Past Surgical History: Past Surgical History:  Procedure Laterality Date   TESTICLE SURGERY  1982    Allergies: No Known Allergies  Outpatient Meds: Current Outpatient Medications  Medication Sig Dispense Refill   amLODipine (NORVASC) 2.5 MG tablet Take 1 tablet (2.5 mg total) by mouth daily. 30 tablet 1   aspirin 325 MG tablet Take 325 mg by mouth every 4 (four) hours as needed. Pain     ibuprofen (ADVIL,MOTRIN) 200 MG tablet Take 200 mg by mouth every 6 (six) hours as needed. Pain     Current Facility-Administered Medications  Medication Dose Route Frequency Provider Last Rate Last Admin   0.9 %  sodium chloride infusion  500 mL Intravenous Once Charlie Pitter III, MD          ___________________________________________________________________ Objective   Exam:  BP (!) 158/79   Pulse 75   Temp 98.6 F (37 C)   Ht 6\' 1"  (1.854 m)   Wt 150 lb (68 kg)   SpO2 99%   BMI 19.79 kg/m   CV: regular , S1/S2 Resp: clear to auscultation bilaterally, normal RR and effort noted GI: soft, no tenderness, with active bowel sounds.   Assessment: Encounter Diagnosis  Name Primary?   Family history of colon cancer in mother Yes     Plan: Colonoscopy   The benefits and risks of the planned procedure were described in detail with the patient or (when appropriate) their health care proxy.  Risks were outlined as including, but not limited to, bleeding, infection, perforation, adverse medication reaction leading to cardiac or pulmonary decompensation,  pancreatitis (if ERCP).  The limitation of incomplete mucosal visualization was also discussed.  No guarantees or warranties were given.  The patient is appropriate for an endoscopic procedure in the ambulatory setting.   - Amada Jupiter, MD

## 2023-01-02 NOTE — Progress Notes (Signed)
Uneventful anesthetic. Patient waking up near end of procedure; PIV felt fine but suspected infiltration. Due to proximity to the end of the procedure, decision was made to continue without placing a new PIV. Patient tolerated procedure well. Report to pacu rn. Warm pack placed over PIV, VSS with HTN. Care resumed by rn.

## 2023-01-02 NOTE — Progress Notes (Signed)
Patient has chronic high blood pressure, patient instructed to take his medicine as soon as he gets home.  Also advised patient to call Primary doctor and schedule appointment for blood pressure medication management.  Ok to discharge patient home per Dr. Myrtie Neither.

## 2023-01-02 NOTE — Op Note (Signed)
[facility] Patient Name: Isaac Parsons Procedure Date: 01/02/2023 8:27 AM MRN: [id number] Endoscopist: Sherilyn Cooter L. Myrtie Neither , MD, [id number] Age: 67 Referring MD:  Date of Birth: April 13, 1956 Gender: Male Account #: [id number] Procedure:                Colonoscopy Indications:              Colon cancer screening in patient at increased                            risk: Colorectal cancer in mother, This is the                            patient's first colonoscopy Medicines:                Monitored Anesthesia Care Procedure:                Pre-Anesthesia Assessment:                           - Prior to the procedure, a History and Physical                            was performed, and patient medications and                            allergies were reviewed. The patient's tolerance of                            previous anesthesia was also reviewed. The risks                            and benefits of the procedure and the sedation                            options and risks were discussed with the patient.                            All questions were answered, and informed consent                            was obtained. Prior Anticoagulants: The patient has                            taken no anticoagulant or antiplatelet agents. ASA                            Grade Assessment: II - A patient with mild systemic                            disease. After reviewing the risks and benefits,                            the patient was deemed in satisfactory condition to  undergo the procedure.                           After obtaining informed consent, the colonoscope                            was passed under direct vision. Throughout the                            procedure, the patient's blood pressure, pulse, and                            oxygen saturations were monitored continuously. The                            Olympus PCF-H190DL [id number])  Colonoscope was                            introduced through the anus and advanced to the the                            cecum, identified by appendiceal orifice and                            ileocecal valve. The colonoscopy was performed                            without difficulty. The patient tolerated the                            procedure well. The quality of the bowel                            preparation was excellent. The ileocecal valve,                            appendiceal orifice, and rectum were photographed. Scope In: 8:33:45 AM Scope Out: 8:48:37 AM Scope Withdrawal Time: 0 hours 10 minutes 51 seconds  Total Procedure Duration: 0 hours 14 minutes 52 seconds  Findings:                 The perianal and digital rectal examinations were                            normal.                           Repeat examination of right colon under NBI                            performed.                           The entire examined colon appeared normal on direct  and retroflexion views. Complications:            No immediate complications. Estimated Blood Loss:     Estimated blood loss: none. Impression:               - The entire examined colon is normal on direct and                            retroflexion views.                           - No specimens collected. Recommendation:           - Patient has a contact number available for                            emergencies. The signs and symptoms of potential                            delayed complications were discussed with the                            patient. Return to normal activities tomorrow.                            Written discharge instructions were provided to the                            patient.                           - Resume previous diet.                           - Continue present medications.                           - Repeat colonoscopy in 5 years for screening                             purposes. Erryn Dickison L. Myrtie Neither, MD 01/02/2023 8:52:29 AM This report has been signed electronically.

## 2023-01-05 ENCOUNTER — Telehealth: Payer: Self-pay | Admitting: *Deleted

## 2023-01-05 NOTE — Telephone Encounter (Signed)
  Follow up Call-     01/02/2023    7:32 AM  Call back number  Post procedure Call Back phone  # [phone]  Permission to leave phone message Yes     Patient questions:  Do you have a fever, pain , or abdominal swelling? No. Pain Score  0 *  Have you tolerated food without any problems? Yes.    Have you been able to return to your normal activities? Yes.    Do you have any questions about your discharge instructions: Diet   No. Medications  No. Follow up visit  No.  Do you have questions or concerns about your Care? No.  Actions: * If pain score is 4 or above: No action needed, pain <4.

## 2023-01-22 DIAGNOSIS — I10 Essential (primary) hypertension: Secondary | ICD-10-CM | POA: Diagnosis not present

## 2023-02-03 ENCOUNTER — Ambulatory Visit: Payer: Medicare HMO | Admitting: Family Medicine

## 2023-02-09 ENCOUNTER — Ambulatory Visit: Payer: Medicare HMO | Admitting: Family Medicine

## 2023-11-17 DIAGNOSIS — I10 Essential (primary) hypertension: Secondary | ICD-10-CM | POA: Diagnosis not present

## 2023-11-17 DIAGNOSIS — M25552 Pain in left hip: Secondary | ICD-10-CM | POA: Diagnosis not present

## 2024-03-18 DIAGNOSIS — H5213 Myopia, bilateral: Secondary | ICD-10-CM | POA: Diagnosis not present

## 2024-03-18 DIAGNOSIS — H35033 Hypertensive retinopathy, bilateral: Secondary | ICD-10-CM | POA: Diagnosis not present

## 2024-03-18 DIAGNOSIS — H2513 Age-related nuclear cataract, bilateral: Secondary | ICD-10-CM | POA: Diagnosis not present

## 2024-03-18 DIAGNOSIS — H524 Presbyopia: Secondary | ICD-10-CM | POA: Diagnosis not present

## 2024-03-18 DIAGNOSIS — H52223 Regular astigmatism, bilateral: Secondary | ICD-10-CM | POA: Diagnosis not present

## 2024-04-29 DIAGNOSIS — I10 Essential (primary) hypertension: Secondary | ICD-10-CM | POA: Diagnosis not present

## 2024-04-29 DIAGNOSIS — H2512 Age-related nuclear cataract, left eye: Secondary | ICD-10-CM | POA: Diagnosis not present

## 2024-04-29 DIAGNOSIS — H2513 Age-related nuclear cataract, bilateral: Secondary | ICD-10-CM | POA: Diagnosis not present
